# Patient Record
Sex: Female | Born: 1960 | Hispanic: Yes
Health system: Southern US, Community
[De-identification: ages and names within clinical notes are randomized; demographics above are authoritative.]

---

## 2018-08-02 ENCOUNTER — Ambulatory Visit: Payer: Self-pay | Admitting: Sports Medicine

## 2018-08-02 ENCOUNTER — Other Ambulatory Visit: Payer: Self-pay

## 2019-02-03 DIAGNOSIS — R079 Chest pain, unspecified: Secondary | ICD-10-CM

## 2019-02-03 DIAGNOSIS — R0789 Other chest pain: Secondary | ICD-10-CM

## 2019-02-03 DIAGNOSIS — K219 Gastro-esophageal reflux disease without esophagitis: Secondary | ICD-10-CM

## 2020-04-03 DIAGNOSIS — E875 Hyperkalemia: Secondary | ICD-10-CM

## 2020-04-07 ENCOUNTER — Telehealth: Payer: Self-pay

## 2020-04-07 NOTE — Telephone Encounter (Signed)
NOTES ON FILE FROM  [facility] [phone], SENT REFERRAL TO SCHEDULING 

## 2020-04-16 ENCOUNTER — Encounter: Payer: Self-pay | Admitting: General Practice

## 2021-10-07 ENCOUNTER — Other Ambulatory Visit: Payer: Self-pay | Admitting: Family Medicine

## 2021-10-07 DIAGNOSIS — Z1231 Encounter for screening mammogram for malignant neoplasm of breast: Secondary | ICD-10-CM

## 2021-10-10 ENCOUNTER — Other Ambulatory Visit: Payer: Self-pay | Admitting: Family Medicine

## 2021-10-10 DIAGNOSIS — Z1231 Encounter for screening mammogram for malignant neoplasm of breast: Secondary | ICD-10-CM

## 2021-10-28 ENCOUNTER — Other Ambulatory Visit: Payer: Self-pay

## 2021-10-28 ENCOUNTER — Ambulatory Visit
Admission: RE | Admit: 2021-10-28 | Discharge: 2021-10-28 | Disposition: A | Discharge status: Home / Self Care | Payer: No Typology Code available for payment source | Source: Ambulatory Visit | Attending: Family Medicine | Admitting: Family Medicine

## 2021-10-28 DIAGNOSIS — Z1231 Encounter for screening mammogram for malignant neoplasm of breast: Secondary | ICD-10-CM

## 2023-04-11 IMAGING — MG MM DIGITAL SCREENING BILAT W/ TOMO AND CAD
8 series · 9 of 24 positions shown · non-contrast
Comparison: None.

CLINICAL DATA: Screening.

EXAM:
DIGITAL SCREENING BILATERAL MAMMOGRAM WITH TOMOSYNTHESIS AND CAD
TECHNIQUE: Bilateral screening digital craniocaudal and mediolateral oblique
mammograms were obtained. Bilateral screening digital breast
tomosynthesis was performed. The images were evaluated with
computer-aided detection.

[R CC synth-2D]
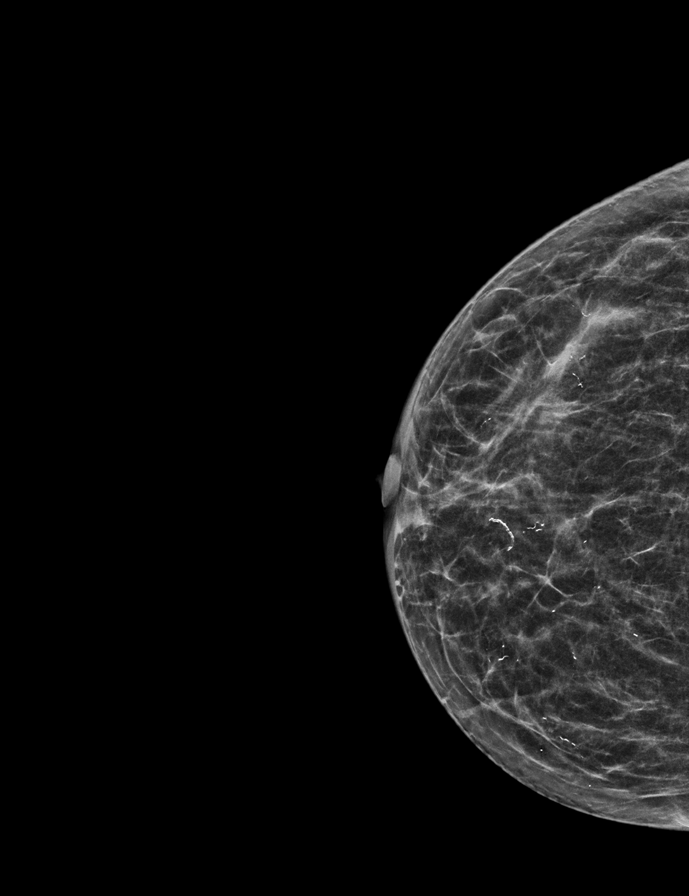

[L CC synth-2D]
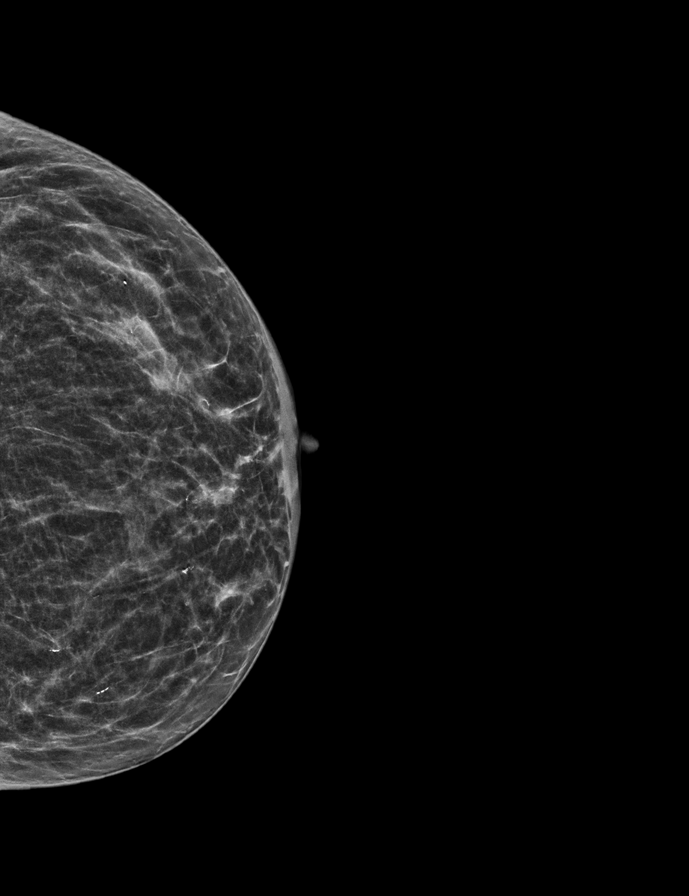

[R MLO synth-2D]
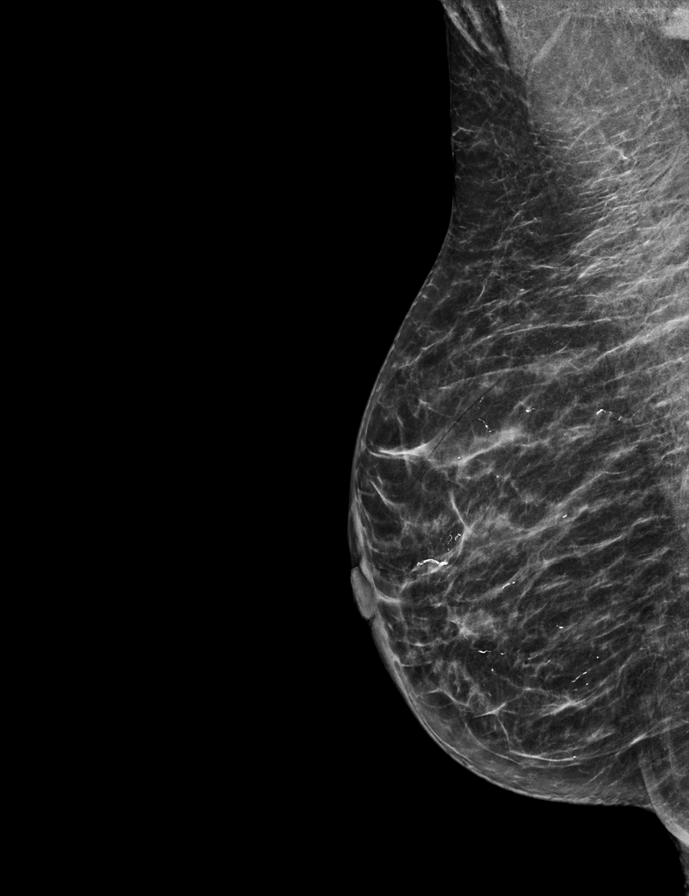

[L MLO synth-2D]
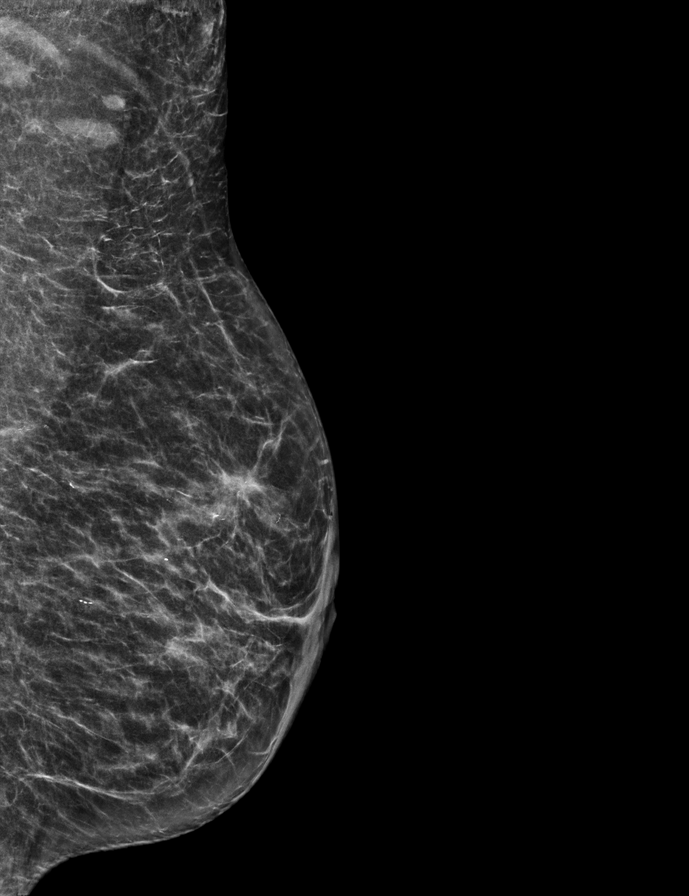

[R CC tomo · 2 of 52 frames shown]
[frame 17/52]
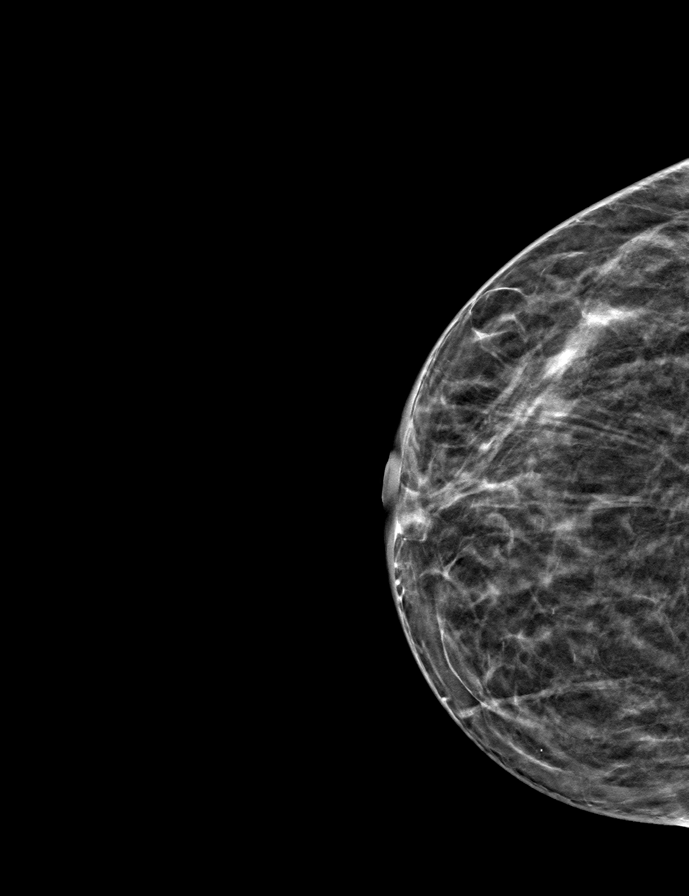
[frame 27/52]
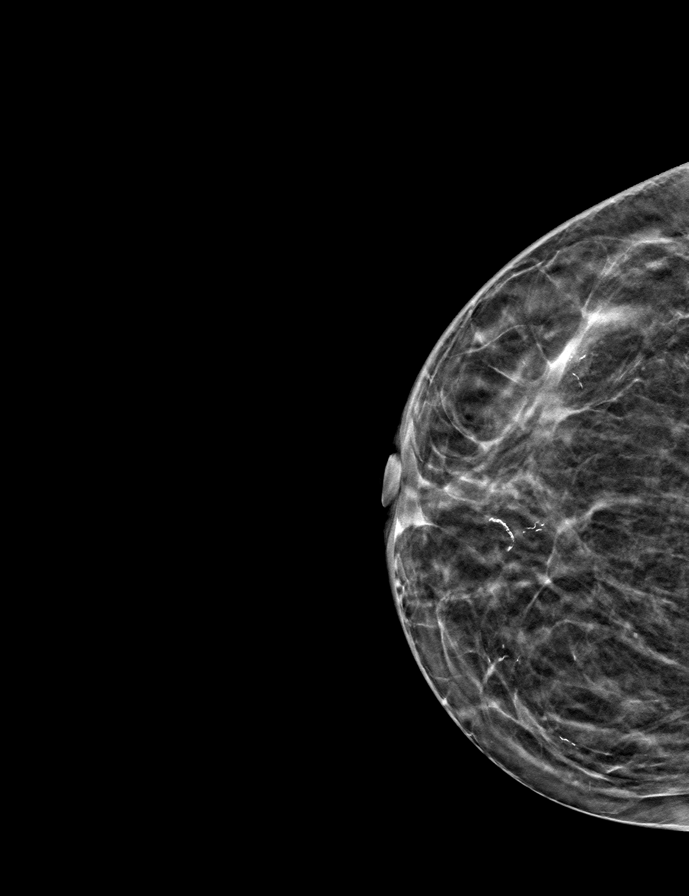

[L CC tomo · tomo slice 25/49.0]
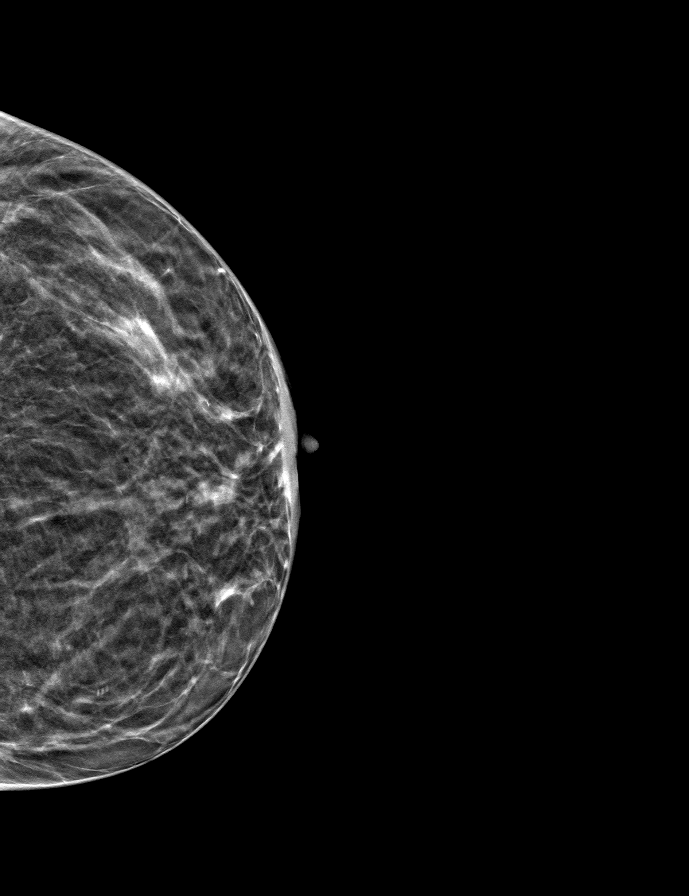

[R MLO tomo · tomo slice 28/55.0]
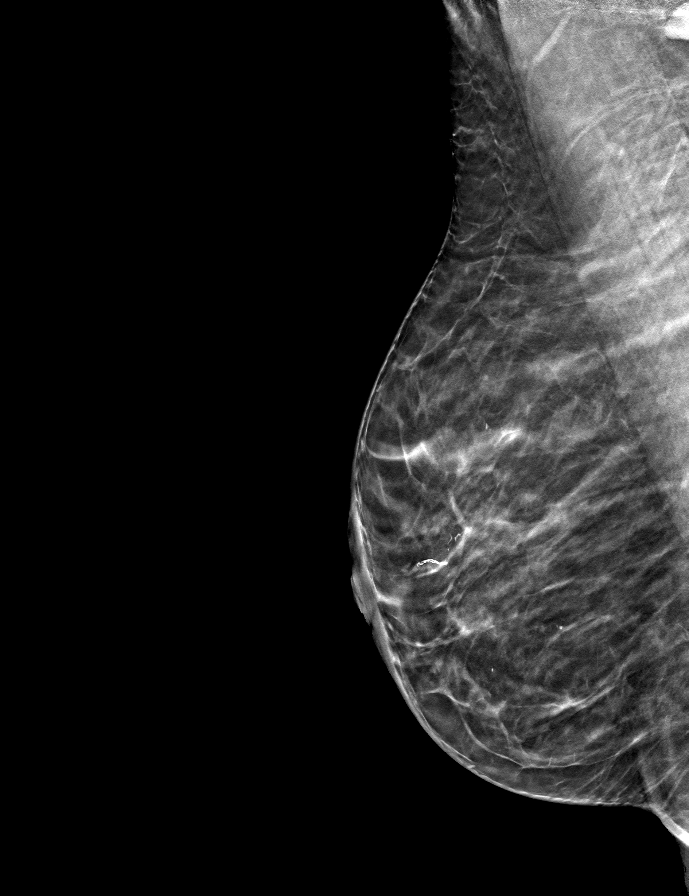

[L MLO tomo · tomo slice 29/56.0]
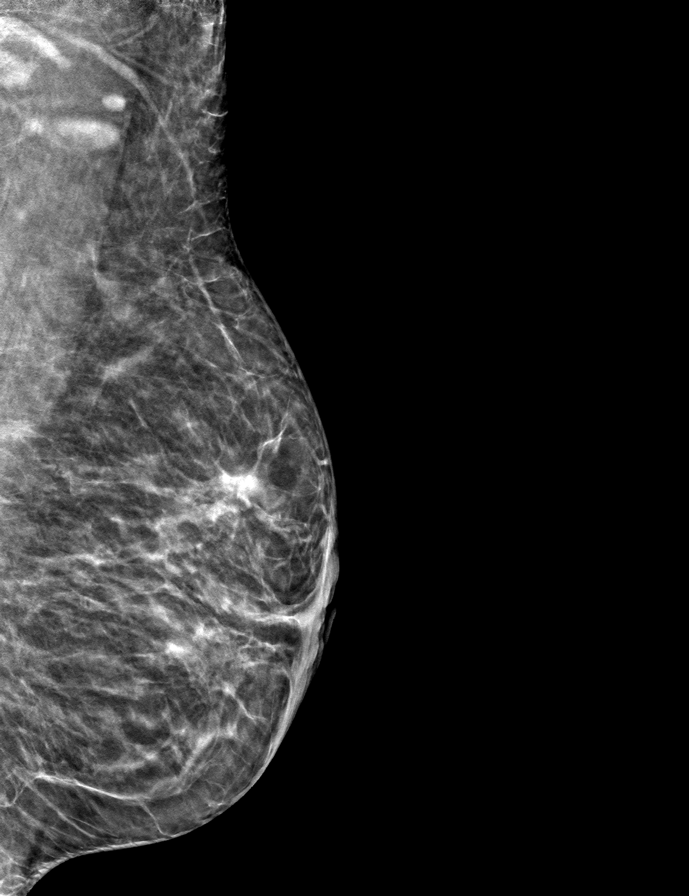

[9 of 24 positions shown; findings below may reference images not displayed]

ACR Breast Density Category b: There are scattered areas of
fibroglandular density.
FINDINGS: There are no findings suspicious for malignancy.
IMPRESSION: No mammographic evidence of malignancy. A result letter of this
screening mammogram will be mailed directly to the patient.

RECOMMENDATION:
Screening mammogram in one year. (Code:XG-X-X7B)

BI-RADS CATEGORY  1: Negative.

## 2023-12-19 NOTE — H&P (Signed)
 [place] INTERVENTIONAL NEPHROLOGY- Pre Procedure H/P  Patient name: Molly Phillips  CSN: [id number]  MRN: [id number]  Date of Procedure: 12/19/2023    Assessment/Plan:    [name] is a 63 y.o. female who will undergo fistulogram with possible angioplasty and/or stent placement under moderate sedation in interventional nephrology at [facility] endovascular center.     Consent obtained in the Pre Op holding area by Dr. Tiffany Kocher.  Risks, benefits, and alternatives including but not limited to risks like bleeding, blood clots, infection, embolism, thromboembolism, pain, vascular injury were discussed with patient/patient's representative. All questions were answered to patient/patient's representative satisfaction.  Patient/Patient's representative consents and would like to proceed with the procedure.   --The patient will accept blood products in an emergent situation.  --The patient does not have a Do Not Resuscitate order in effect.      HPI: [name] is a 63 y.o. female with ESRD on intermittent hemodialysis via a left upper arm brachial basilic AV fistula comes in for a fistulogram due to prolonged bleeding postdialysis.  Patient denies any other complaints today.  On physical exam the fistula is highly pulsatile on palpation and does not collapse on arm elevation.    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, unspecified proliferative retinopathy type    DM (diabetes mellitus) (CMS-HCC) Dx 08/2016    Type II    Hypertension     Peripheral chorioretinal scars of both eyes     Stable       Past Surgical History:   Procedure Laterality Date    PR AV ANAST,UP ARM BASILIC VEIN TRANSPOSIT Left 05/12/3085    Procedure: ARTERIOVENOUS ANASTOMOSIS, OPEN; BY UPPER ARM BASILIC VEIN TRANSPOSITION;  Surgeon: Leona Carry, MD;  Location: MAIN OR [place];  Service: Transplant    PR CREAT AV FISTULA,NON-AUTOGENOUS GRAFT N/A 08/04/2022    Procedure: CREATE AV FISTULA (SEPARATE PROC); NONAUTOGENOUS GRAFT (EG, BIOLOGICAL COLLAGEN, THERMOPLASTIC GRAFT);  Surgeon: Leona Carry, MD;  Location: MAIN OR [facility];  Service: Transplant    PR REVISE AV FISTULA,W/O THROMBECTOMY Left 02/23/2023    Procedure: REVISON, OPEN, ARTERIOVENOUS FISTULA; WITHOUT THROMBECTOMY, AUTOGENOUS OR NONAUTOGENOUS DIALYSIS GRAFT;  Surgeon: Toledo, Lilyan Punt, MD;  Location: MAIN OR [facility];  Service: Transplant    PR VITRECTOMY,PANRETINAL LASER RX Left 11/03/2020    Procedure: VITRECTOMY, MECHANICAL, PARS PLANA APPROACH; WITH ENDOLASER PANRETINAL PHOTOCOAGULATION;  Surgeon: Melvyn Novas, MD;  Location: [facility] OR [facility];  Service: Ophthalmology    PR Hessie Knows LASER RX Right 06/30/2022    Procedure: VITRECTOMY, MECHANICAL, PARS PLANA APPROACH; WITH ENDOLASER PANRETINAL PHOTOCOAGULATION;  Surgeon: Nicholaus Corolla, MD;  [facility]: [facility] OR [facility];  Service: Ophthalmology    PR XCAPSL CTRC RMVL INSJ IO LENS PROSTH CPLX WO ECP Right 07/05/2023    Procedure: EXTRACAPSULAR CATARACT REMOVAL WITH INSERTION OF INTRAOCULAR LENS PROSTHESIS, COMPLEX WITHOUT ENDOSCOPIC CYCLOPHOTOCOAGULATION;  Surgeon: Sedalia Muta, MD;  Location: [facility];  Service: Ophthalmology    PR XCAPSL CTRC RMVL INSJ IO LENS PROSTH W/O ECP Left 04/20/2023    Procedure: EXTRACAPSULAR CATARACT REMOVAL W/INSERTION OF INTRAOCULAR LENS PROSTHESIS, MANUAL OR MECHANICAL TECHNIQUE WITHOUT ENDOSCOPIC CYCLOPHOTOCOAGULATION;  Surgeon: Sedalia Muta, MD;  Location: [facility];  Service: Ophthalmology  Allergies:   Allergies   Allergen Reactions    Fluorescein Sodium Other (See Comments)     Itchiness   Itchiness     Naprosyn [Naproxen] Itching    Penicillins Rash       Medications:  No relevant medications, please see full medication list in Epic.    ASA Grade: ASA 3 - Patient with moderate systemic disease with functional limitations    PE:    Vitals:    12/19/23 1331   BP: 169/65   Pulse: 66   Resp: 18   Temp: 36.6 ??C (97.9 ??F)   SpO2: 98%     General: female in NAD.  Airway assessment: Class 1 - Can visualize soft palate, fauces, uvula, and tonsillar pillars  Lungs: Respirations nonlabored  Extremities: On physical exam the fistula is highly pulsatile on palpation and does not collapse on arm elevation.

## 2024-06-04 NOTE — ED Provider Notes (Signed)
 [facility] EMERGENCY DEPARTMENT ENCOUNTER        CLINICAL IMPRESSION  Final diagnoses:   Hyperkalemia (Primary)   Elevated blood pressure reading with diagnosis of hypertension   Dialysis patient       ASSESSMENT & PLAN    63 year old dialysis patient presenting with uncontrolled high blood pressures, shortness of breath, and headache.    She is already starting to feel better, as her blood pressure is coming down after taking her regular home medications.    EKG, CBC, CMP, troponin performed here at the hospital.  Noted to have hyperkalemia with a potassium 6.0.    Reassuring EKG.  This is certainly higher than her baseline.    Given IV Lasix  80 mg, which did result in an episode of urination.  She was also given 5 mg of Lokelma .    Labs repeated.  She feels much better, breathing is improved, blood pressures have also come down, no hypoxemia.    Potassium now down to 5, which is stable and consistent at her baseline.    Discharged home with her family in stable condition, return precautions in case of any worsening, encouraged to maintain excellent compliance with her home medications, she does have access to these and refills are current.  She will proceed with dialysis on Tuesday as scheduled.      _______________________________________________________________    Time seen: June 04, 2024 3:59 AM    I have reviewed the triage vital signs and the nursing notes.    CHIEF COMPLAINT    Chief Complaint   Patient presents with    Hypertension       History of Present Illness  Molly Phillips is a 63 year old female with history of hypertension who presents with elevated blood pressure and shortness of breath.    She experienced a significant elevation in blood pressure earlier, which improved after taking her regular home medication. She last underwent dialysis on Saturday and is scheduled for her next session on Tuesday.    At this time, she is feeling better, says she almost always feels this way when her blood pressure spikes up.  Specifically her breathing is returned to baseline.  She does occasionally make urine, but has not peed in the last 24 hours.       PAST MEDICAL HISTORY    Past Medical History[1]    SURGICAL HISTORY    Past Surgical History[2]    CURRENT MEDICATIONS    No current facility-administered medications for this encounter.    Current Outpatient Medications:     atorvastatin  (LIPITOR ) 80 MG tablet, Take 1 tablet (80 mg total) by mouth daily., Disp: 100 tablet, Rfl: 3    carvedilol  (COREG ) 25 MG tablet, Take 1 tablet (25 mg total) by mouth two (2) times a day., Disp: 200 tablet, Rfl: 3    dulaglutide  (TRULICITY ) 0.75 mg/0.5 mL injection pen, Inject 1 pen (0.75 mg total) under the skin every seven (7) days., Disp: 6 mL, Rfl: 3    ergocalciferol -1,250 mcg, 50,000 unit, (DRISDOL ) 1,250 mcg (50,000 unit) capsule, Take 1 capsule (1,250 mcg total) by mouth once a week., Disp: 12 capsule, Rfl: 3    famotidine  (PEPCID ) 20 MG tablet, Take 1 tablet (20 mg total) by mouth nightly as needed for heartburn., Disp: 30 tablet, Rfl: 2    ferrous sulfate  325 (65 FE) MG tablet, Take 1 tablet (325 mg total) by mouth in the morning., Disp: 30 tablet, Rfl: 11    lancets (ON  CALL LANCET) 30 gauge Misc, Use to check blood sugar 1 time a day, Disp: 100 each, Rfl: 11    NIFEdipine  (PROCARDIA  XL) 90 MG 24 hr tablet, Take 1 tablet (90 mg total) by mouth daily., Disp: 100 tablet, Rfl: 3    torsemide  (DEMADEX ) 20 MG tablet, Take 2 tablets (40 mg total) by mouth Every Monday, Wednesday, Friday, and Sunday (non-dialysis days), Disp: 90 tablet, Rfl: 3    blood-glucose meter kit, Use as instructed, Disp: 1 each, Rfl: 0    hydrALAZINE (APRESOLINE) 100 MG tablet, Take 1 tablet (100 mg total) by mouth Three (3) times a day., Disp: 300 tablet, Rfl: 3    iron sucrose 50 mg, OVERFILL 10 mL in sodium chloride 0.9 % 100 mL IVPB, 50 mg., Disp: , Rfl:     lancing device (ON CALL LANCING DEVICE) Misc, Use as directed, Disp: 1 each, Rfl: 0 loperamide (IMODIUM) 0.2 mg/mL Liqd oral solution, Take 2 mg by mouth., Disp: , Rfl:     ALLERGIES    Allergies[3]    FAMILY HISTORY    Family History[4]    SOCIAL HISTORY  Social History[5]    REVIEW OF SYSTEMS    Patient denies fever, chills, focal weakness  Positive headache  No eye pain or vision changes  No ear pain or decreased hearing  No sore throat or difficulty swallowing  No chest pain  No cough positive shortness of breath  No nausea, vomiting, diarrhea, or bloody stools  No joint pain or swelling  No skin rashes or lesions  No syncope or seizure activity  Denies depressed mood or thought disturbance      10  point ROS negative except as marked above and in HPI.    PHYSICAL EXAM      VITAL SIGNS:    ED Triage Vitals   Enc Vitals Group      BP 06/03/24 2242 204/82      Pulse 06/03/24 2242 72      SpO2 Pulse 06/04/24 0226 66      Resp 06/03/24 2242 22      Temp 06/03/24 2242 36.6 ??C (97.9 ??F)      Temp Source 06/03/24 2242 Temporal      SpO2 06/03/24 2242 95 %      Weight 06/03/24 2242 46.3 kg (102 lb)      Height 06/03/24 2242 1.499 m (4' 11)                                       General: alert and oriented, resting comfortably in no distress  MMM, benign oropharynx  Nares clear without discharge  TMs clear without erythema or effusion  Neck supple without nodes, normal thyroid  RRR with 2 out of 6 holosystolic murmur, no JVD, no bruits, 2+ peripheral pulses  Lungs with bibasilar crackles, left greater than right, easy work of breathing,  Abd soft, non-tender, not distended, normal bowel sounds, no HSM, no CVA tenderness  Ext warm and well perfused, no peripheral edema  No painful or swollen joints, ambulates without assistance  Skin warm, dry, intact.  No rashes, lesions, or open sores  Neuro: No focal deficits  Psych: clear and appropriate, full affect, denies depressed mood      EKG   Normal sinus rhythm, rate 71, QTc 462.  Normal axis and intervals, no evidence of acute ischemia.  LABS  Recent Results (from the past 24 hours)   ECG 12 Lead    Collection Time: 06/03/24 10:36 PM   Result Value Ref Range    EKG Systolic BP  mmHg    EKG Diastolic BP  mmHg    EKG Ventricular Rate 71 BPM    EKG Atrial Rate 71 BPM    EKG P-R Interval 188 ms    EKG QRS Duration 72 ms    EKG Q-T Interval 426 ms    EKG QTC Calculation 462 ms    EKG Calculated P Axis 58 degrees    EKG Calculated R Axis 42 degrees    EKG Calculated T Axis 83 degrees    QTC Fredericia 450 ms   Comprehensive Metabolic Panel    Collection Time: 06/03/24 10:43 PM   Result Value Ref Range    Sodium 137 135 - 145 mmol/L    Potassium 6.0 (H) 3.5 - 5.0 mmol/L    Chloride 96 (L) 98 - 107 mmol/L    CO2 27.0 22.0 - 32.0 mmol/L    Anion Gap 14 5 - 14 mmol/L    BUN 51 (H) 7 - 21 mg/dL    Creatinine 4.09 (H) 0.60 - 1.00 mg/dL    BUN/Creatinine Ratio 9     eGFR CKD-EPI (2021) Female 8 (L) >=60 mL/min/1.7m2    Glucose 95 74 - 106 mg/dL    Calcium 8.8 8.5 - 89.7 mg/dL    Albumin 4.3 3.4 - 5.0 g/dL    Total Protein 7.7 6.5 - 8.3 g/dL    Total Bilirubin 0.5 0.1 - 1.2 mg/dL    AST 29 14 - 38 U/L    ALT 39 (H) <35 U/L    Alkaline Phosphatase 193 (H) 38 - 126 U/L   PT-INR    Collection Time: 06/03/24 10:43 PM   Result Value Ref Range    PT 11.8 9.9 - 12.6 sec    INR 1.04 Undefined   Troponin I    Collection Time: 06/03/24 10:43 PM   Result Value Ref Range    Troponin I <0.034 <0.034 ng/mL   CBC w/ Differential    Collection Time: 06/03/24 10:43 PM   Result Value Ref Range    WBC 7.0 3.6 - 11.2 10*9/L    RBC 3.69 (L) 3.95 - 5.13 10*12/L    HGB 12.0 11.3 - 14.9 g/dL    HCT 64.8 65.9 - 55.9 %    MCV 95.2 77.6 - 95.7 fL    MCH 32.6 (H) 25.9 - 32.4 pg    MCHC 34.2 32.0 - 36.0 g/dL    RDW 83.8 (H) 87.7 - 15.2 %    MPV 7.7 6.8 - 10.7 fL    Platelet 192 150 - 450 10*9/L    nRBC 0 <=4 /100 WBCs    Neutrophils % 68.6 %    Lymphocytes % 19.7 %    Monocytes % 9.4 %    Eosinophils % 1.7 %    Basophils % 0.6 %    Absolute Neutrophils 4.8 1.8 - 7.8 10*9/L    Absolute Lymphocytes 1.4 1.1 - 3.6 10*9/L    Absolute Monocytes 0.7 0.3 - 0.8 10*9/L    Absolute Eosinophils 0.1 0.0 - 0.5 10*9/L    Absolute Basophils 0.0 0.0 - 0.1 10*9/L   Basic metabolic panel    Collection Time: 06/04/24  2:26 AM   Result Value Ref Range    Sodium 137 135 - 145 mmol/L  Potassium 5.0 3.5 - 5.0 mmol/L    Chloride 100 98 - 107 mmol/L    CO2 26.0 22.0 - 32.0 mmol/L    Anion Gap 11 5 - 14 mmol/L    BUN 50 (H) 7 - 21 mg/dL    Creatinine 4.49 (H) 0.60 - 1.00 mg/dL    BUN/Creatinine Ratio 9     eGFR CKD-EPI (2021) Female 8 (L) >=60 mL/min/1.16m2    Glucose 87 74 - 106 mg/dL    Calcium 8.3 (L) 8.5 - 10.2 mg/dL         PROCEDURE      ED COURSE  Pertinent labs & imaging results that were available during my care of the patient were reviewed by me (see chart for details).        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, unspecified proliferative retinopathy type    DM (diabetes mellitus)    Dx 08/2016    Type II    ESRD (end stage renal disease) on dialysis        Hypertension     Peripheral chorioretinal scars of both eyes     Stable   [2]   Past Surgical History:  Procedure Laterality Date    PR AV ANAST,UP ARM BASILIC VEIN TRANSPOSIT Left 0/86/7976    Procedure: ARTERIOVENOUS ANASTOMOSIS, OPEN; BY UPPER ARM BASILIC VEIN TRANSPOSITION;  Surgeon: Marsa Sam Boss, MD;  Location: MAIN OR [place];  Service: Transplant    PR CREAT AV FISTULA,NON-AUTOGENOUS GRAFT N/A 08/04/2022    Procedure: CREATE AV FISTULA (SEPARATE PROC); NONAUTOGENOUS GRAFT (EG, BIOLOGICAL COLLAGEN, THERMOPLASTIC GRAFT);  Surgeon: Marsa Sam Boss, MD;  Location: MAIN OR [facility];  Service: Transplant    PR REVISE AV FISTULA,W/O THROMBECTOMY Left 02/23/2023    Procedure: REVISON, OPEN, ARTERIOVENOUS FISTULA; WITHOUT THROMBECTOMY, AUTOGENOUS OR NONAUTOGENOUS DIALYSIS GRAFT;  Surgeon: Toledo, Marsa Sam, MD;  [facility];  Service: Transplant    PR VITRECTOMY,PANRETINAL LASER RX Left 11/03/2020    Procedure: VITRECTOMY, MECHANICAL, PARS PLANA APPROACH; WITH ENDOLASER PANRETINAL PHOTOCOAGULATION;  Surgeon: Madison Concetta Hoof, MD;  Location: [facility] OR [facility];  Service: Ophthalmology    PR PINKIE BOSWORTH LASER RX Right 06/30/2022    Procedure: VITRECTOMY, MECHANICAL, PARS PLANA APPROACH; WITH ENDOLASER PANRETINAL PHOTOCOAGULATION;  Surgeon: Diona Mana, MD;  Location: [facility] OR [facility];  Service: Ophthalmology    PR XCAPSL CTRC RMVL INSJ IO LENS PROSTH CPLX WO ECP Right 07/05/2023    Procedure: EXTRACAPSULAR CATARACT REMOVAL WITH INSERTION OF INTRAOCULAR LENS PROSTHESIS, COMPLEX WITHOUT ENDOSCOPIC CYCLOPHOTOCOAGULATION;  Surgeon: Klifto, Meredith Remmer, MD;  Location: [facility];  Service: Ophthalmology    PR XCAPSL CTRC RMVL INSJ IO LENS PROSTH W/O ECP Left 04/20/2023    Procedure: EXTRACAPSULAR CATARACT REMOVAL W/INSERTION OF INTRAOCULAR LENS PROSTHESIS, MANUAL OR MECHANICAL TECHNIQUE WITHOUT ENDOSCOPIC CYCLOPHOTOCOAGULATION;  Surgeon: Klifto, Meredith Remmer, MD;  Location: [facility];  Service: Ophthalmology   [3]   Allergies  Allergen Reactions    Fluorescein Sodium Other (See Comments)     Itchiness   Itchiness     Naprosyn [Naproxen] Itching    Penicillins Rash   [4]  Family History  Problem Relation Age of Onset    Diabetes Mother     Retinal detachment Mother     Diabetes Sister     Diabetes Brother     No Known Problems Father     No Known Problems Maternal Aunt     No Known Problems Maternal Uncle     No Known Problems Paternal Aunt     No Known Problems Paternal Uncle     No Known Problems Maternal Grandmother     No Known Problems Maternal Grandfather     No Known Problems Paternal Grandmother     No Known Problems Paternal Grandfather     No Known Problems Other     Amblyopia Neg Hx Blindness Neg Hx     Cancer Neg Hx     Cataracts Neg Hx     Glaucoma Neg Hx     Hypertension Neg Hx     Macular degeneration Neg Hx     Strabismus Neg Hx     Stroke Neg Hx     Thyroid disease Neg Hx    [5]   Social History  Socioeconomic History    Marital status: Single     Spouse name: None    Number of children: None    Years of education: None    Highest education level: None   Tobacco Use    Smoking status: Never     Passive exposure: Never    Smokeless tobacco: Never   Vaping Use    Vaping status: Never Used   Substance and Sexual Activity    Alcohol use: Never    Drug use: Never    Sexual activity: Not Currently     Social Drivers of Health     Financial Resource Strain: Low Risk  (01/17/2023)    Overall Financial Resource Strain (CARDIA)     Difficulty of Paying Living Expenses: Not hard at all   Food Insecurity: No Food Insecurity (10/28/2023)    Hunger Vital Sign     Worried About Running Out of Food in the Last Year: Never true     Ran Out of Food in the Last Year: Never true   Transportation Needs: No Transportation Needs (10/28/2023)    PRAPARE - Therapist, art (Medical): No     Lack of Transportation (Non-Medical): No   Physical Activity: Not on File (03/11/2022)    Received from [facility]    Physical Activity     Physical Activity: 0   Stress: Not on File (03/11/2022)    Received from [facility]    Stress     Stress: 0   Social Connections: Not on File (08/06/2023)    Received from [facility]    Social Connections     Connectedness: 0   Housing: Low Risk  (10/28/2023)    Housing     Within the past 12 months, have you ever stayed: outside, in a car, in a tent, in an overnight shelter, or temporarily in someone else's home (i.e. couch-surfing)?: No     Are you worried about losing your housing?: No        Brentt Fread, Nidia Ruts, MD  06/04/24 [phone]

## 2024-07-12 NOTE — Progress Notes (Signed)
*  Possible allergy to fluorescein - reported itchiness after FA*     63 y.o. female with hx of HTN, DM, CKD presents for follow up.    Last A1c 03/2023 was 6.9. On trulicity .     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 Hunter

## 2024-07-21 ENCOUNTER — Emergency Department (HOSPITAL_COMMUNITY)

## 2024-07-21 ENCOUNTER — Other Ambulatory Visit: Payer: Self-pay

## 2024-07-21 ENCOUNTER — Emergency Department (HOSPITAL_COMMUNITY)
Admission: EM | Admit: 2024-07-21 | Discharge: 2024-07-21 | Disposition: A | Discharge status: Home / Self Care | Attending: Emergency Medicine | Admitting: Emergency Medicine

## 2024-07-21 DIAGNOSIS — E1122 Type 2 diabetes mellitus with diabetic chronic kidney disease: Secondary | ICD-10-CM | POA: Insufficient documentation

## 2024-07-21 DIAGNOSIS — Z992 Dependence on renal dialysis: Secondary | ICD-10-CM | POA: Insufficient documentation

## 2024-07-21 DIAGNOSIS — R1013 Epigastric pain: Secondary | ICD-10-CM | POA: Insufficient documentation

## 2024-07-21 DIAGNOSIS — I120 Hypertensive chronic kidney disease with stage 5 chronic kidney disease or end stage renal disease: Secondary | ICD-10-CM | POA: Insufficient documentation

## 2024-07-21 DIAGNOSIS — R112 Nausea with vomiting, unspecified: Secondary | ICD-10-CM | POA: Insufficient documentation

## 2024-07-21 DIAGNOSIS — N186 End stage renal disease: Secondary | ICD-10-CM | POA: Insufficient documentation

## 2024-07-21 DIAGNOSIS — N189 Chronic kidney disease, unspecified: Secondary | ICD-10-CM

## 2024-07-21 DIAGNOSIS — D631 Anemia in chronic kidney disease: Secondary | ICD-10-CM | POA: Insufficient documentation

## 2024-07-21 LAB — COMPREHENSIVE METABOLIC PANEL WITH GFR
ALT: 34 U/L (ref 0–44)
AST: 29 U/L (ref 15–41)
Albumin: 3.5 g/dL (ref 3.5–5.0)
Alkaline Phosphatase: 103 U/L (ref 38–126)
Anion gap: 14 (ref 5–15)
BUN: 48 mg/dL — ABNORMAL HIGH (ref 8–23)
CO2: 27 mmol/L (ref 22–32)
Calcium: 8.8 mg/dL — ABNORMAL LOW (ref 8.9–10.3)
Chloride: 94 mmol/L — ABNORMAL LOW (ref 98–111)
Creatinine, Ser: 4.96 mg/dL — ABNORMAL HIGH (ref 0.44–1.00)
GFR, Estimated: 9 mL/min — ABNORMAL LOW (ref >60)
Glucose, Bld: 124 mg/dL — ABNORMAL HIGH (ref 70–99)
Potassium: 5.1 mmol/L (ref 3.5–5.1)
Sodium: 135 mmol/L (ref 135–145)
Total Bilirubin: 0.6 mg/dL (ref 0.0–1.2)
Total Protein: 6.8 g/dL (ref 6.5–8.1)

## 2024-07-21 LAB — CBC
HCT: 30.1 % — ABNORMAL LOW (ref 36.0–46.0)
Hemoglobin: 9.9 g/dL — ABNORMAL LOW (ref 12.0–15.0)
MCH: 32.7 pg (ref 26.0–34.0)
MCHC: 32.9 g/dL (ref 30.0–36.0)
MCV: 99.3 fL (ref 80.0–100.0)
Platelets: 173 K/uL (ref 150–400)
RBC: 3.03 MIL/uL — ABNORMAL LOW (ref 3.87–5.11)
RDW: 15.5 % (ref 11.5–15.5)
WBC: 7.5 K/uL (ref 4.0–10.5)
nRBC: 0 % (ref 0.0–0.2)

## 2024-07-21 LAB — LIPASE, BLOOD: Lipase: 33 U/L (ref 11–51)

## 2024-07-21 MED ORDER — ONDANSETRON HCL 4 MG/2ML IJ SOLN
4.0000 mg | Freq: Once | INTRAMUSCULAR | Status: AC
  Administered 2024-07-21: 4 mg via INTRAVENOUS
  Filled 2024-07-21: qty 2

## 2024-07-21 MED ORDER — FAMOTIDINE 10 MG PO TABS: 10.0000 mg | ORAL_TABLET | Freq: Two times a day (BID) | ORAL | 0 refills | Status: AC

## 2024-07-21 MED ORDER — SODIUM CHLORIDE 0.9 % IV BOLUS
500.0000 mL | Freq: Once | INTRAVENOUS | Status: AC
  Administered 2024-07-21: 500 mL via INTRAVENOUS

## 2024-07-21 MED ORDER — ONDANSETRON 4 MG PO TBDP: 4.0000 mg | ORAL_TABLET | Freq: Three times a day (TID) | ORAL | 0 refills | Status: AC | PRN

## 2024-07-21 MED ORDER — IOHEXOL 350 MG/ML SOLN
75.0000 mL | Freq: Once | INTRAVENOUS | Status: AC | PRN
  Administered 2024-07-21: 75 mL via INTRAVENOUS

## 2024-07-21 MED ORDER — MORPHINE SULFATE (PF) 4 MG/ML IV SOLN
4.0000 mg | Freq: Once | INTRAVENOUS | Status: DC
  Filled 2024-07-21: qty 1

## 2024-07-21 MED ORDER — OXYCODONE HCL 5 MG PO TABS: 5.0000 mg | ORAL_TABLET | ORAL | 0 refills | Status: AC | PRN

## 2024-07-21 NOTE — Discharge Instructions (Addendum)
 Llame a su centro de dilisis para ver si puede reprogramar su sesin.  Regrese a urgencias si sus sntomas empeoran.

## 2024-07-21 NOTE — ED Triage Notes (Addendum)
 Patient reports persistent emesis with generalized abdominal pressure for several days , denies diarrhea or fever . Hypertensive at triage .

## 2024-07-21 NOTE — ED Provider Notes (Signed)
 [place] [facility] Provider Note   CSN: [id number] Arrival date & time: 07/21/24  9957     Patient presents with: Emesis   Molly Phillips is a 63 y.o. female.   The history is provided by the patient. A language interpreter was used.  Emesis  She has a history of hypertension, diabetes, end-stage renal disease on hemodialysis and comes in because of upper abdominal pain and vomiting for the last week.  She denies fever or chills.  Denies constipation or diarrhea.  She has been having bowel movements.  She has not been able to hold anything down.  Dialysis is done every Tuesday-Thursday-Saturday.    Prior to Admission medications   Medication Sig Start Date End Date Taking? Authorizing Provider  clindamycin (CLEOCIN) 300 MG capsule Take 300 mg by mouth 3 (three) times daily. 07/29/18   [provider]    Allergies: Naproxen and Penicillins    Review of Systems  Gastrointestinal:  Positive for vomiting.  All other systems reviewed and are negative.   Updated Vital Signs BP (!) 188/61 (BP Location: Right Arm)   Pulse 66   Temp 98.1 F (36.7 C)   Resp 14   SpO2 95%   Physical Exam Vitals and nursing note reviewed.   63 year old female, resting comfortably and in no acute distress. Vital signs are significant for elevated blood pressure. Oxygen saturation is 95%, which is normal. Head is normocephalic and atraumatic. PERRLA, EOMI.  Back is nontender and there is no CVA tenderness. Lungs are clear without rales, wheezes, or rhonchi. Chest is nontender. Heart has regular rate and rhythm without murmur. Abdomen is soft, flat, with moderate tenderness across the upper abdomen.  There is no rebound or guarding. Extremities have no cyanosis or edema.  AV fistula is present in the left upper arm with thrill present. Skin is warm and dry without rash. Neurologic: Awake and alert, moves all extremities equally.  (all labs ordered  are listed, but only abnormal results are displayed) Labs Reviewed  COMPREHENSIVE METABOLIC PANEL WITH GFR - Abnormal; Notable for the following components:      Result Value   Chloride 94 (*)    Glucose, Bld 124 (*)    BUN 48 (*)    Creatinine, Ser 4.96 (*)    Calcium 8.8 (*)    GFR, Estimated 9 (*)    All other components within normal limits  CBC - Abnormal; Notable for the following components:   RBC 3.03 (*)    Hemoglobin 9.9 (*)    HCT 30.1 (*)    All other components within normal limits  LIPASE, BLOOD  URINALYSIS, ROUTINE W REFLEX MICROSCOPIC    Radiology: CT ABDOMEN PELVIS W CONTRAST Result Date: 07/21/2024 CLINICAL DATA:  63 year old female with abdominal pain. EXAM: CT ABDOMEN AND PELVIS WITH CONTRAST TECHNIQUE: Multidetector CT imaging of the abdomen and pelvis was performed using the standard protocol following bolus administration of intravenous contrast. RADIATION DOSE REDUCTION: This exam was performed according to the departmental dose-optimization program which includes automated exposure control, adjustment of the mA and/or kV according to patient size and/or use of iterative reconstruction technique. CONTRAST:  75mL OMNIPAQUE  IOHEXOL  350 MG/ML SOLN COMPARISON:  None Available. FINDINGS: Lower chest: Cardiomegaly is moderate. Layering pleural effusions with simple fluid density favoring transudate, moderate to large on the right and small to moderate on the left. Enhancing bilateral lung base atelectasis. No pericardial effusion. Hepatobiliary: Heterogeneously enhancing liver with small volume  perihepatic ascites, simple fluid density along the inferior right hepatic lobe series 3, image 42. And small volume ascites, pericholecystic fluid at the gallbladder fossa. Nondilated gallbladder. No bile duct dilatation. Periportal edema not excluded. No discrete liver lesion. Pancreas: Negative. Spleen: Nonenlarged. Small volume perisplenic and left abdominal ascites with simple  fluid density Adrenals/Urinary Tract: Normal adrenal glands. Diminutive but nonobstructed kidneys. Symmetric renal enhancement. Faint contrast excretion from both kidneys on the delayed images. Diminutive ureters and bladder. Numerous pelvic phleboliths. Stomach/Bowel: Mild to moderate large bowel retained stool in the rectum. Redundant but mostly decompressed upstream sigmoid colon. Similar mild retained stool in the descending and transverse colon. Negative right colon, with normal appendix visible on coronal image 48. Decompressed terminal ileum. Decompressed small bowel throughout the abdomen and pelvis. Decompressed stomach and duodenum. No pneumoperitoneum. Small volume ascites, more so in the left abdomen. Vascular/Lymphatic: Aortoiliac calcified atherosclerosis. Normal caliber abdominal aorta. Major arterial structures are patent despite atherosclerosis. Portal venous system is patent. No lymphadenopathy identified. Reproductive: Within normal limits. Other: No pelvis free fluid. Musculoskeletal: Osteopenia. No acute or suspicious osseous lesion identified. IMPRESSION: 1. Heterogeneous Liver enhancement (nonspecific periportal edemanot excluded), with small volume Ascites. Correlate with LFTs, consider acute or chronic hepatitis, other intrinsic Liver Disease. 2. Moderate to large layering right and small to moderate left pleural effusions with lung base atelectasis. Moderate cardiomegaly. No pericardial effusion. 3. Diminutive, nonobstructed kidneys, suggesting Chronic Renal Disease. There is renal contrast excretion visible on the delayed images. 4. Normal appendix. No bowel obstruction or discrete bowel inflammation. 5.  Aortic Atherosclerosis (ICD10-I70.0). Electronically Signed   By: VEAR Hurst M.D.   On: 07/21/2024 06:43     Procedures   Medications Ordered in the ED  sodium chloride  0.9 % bolus 500 mL (has no administration in time range)  ondansetron  (ZOFRAN ) injection 4 mg (has no  administration in time range)  morphine  (PF) 4 MG/ML injection 4 mg (has no administration in time range)                                    Medical Decision Making Amount and/or Complexity of Data Reviewed Labs: ordered. Radiology: ordered.  Risk OTC drugs. Prescription drug management.   Abdominal pain and vomiting in a patient who has history of end-stage renal disease.  This is her presentation with a wide range of treatment options and carries with it a high risk of morbidity and complications.  Differential diagnosis includes, but is not limited to, acute gastritis, peptic ulcer disease, cholecystitis, pancreatitis, diverticulitis, bowel obstruction.  I have reviewed her laboratory tests, and my interpretation is stable renal insufficiency, normal transaminases and bilirubin, normal lipase, stable anemia, normal WBC.  CT scan shows heterogeneous liver enhancement which is nonspecific, small volume ascites, bilateral pleural effusions, no acute process.  Have independently viewed the images, and agree with the radiologist's interpretation.  No evidence of diverticulitis or bowel obstruction.  She feels much better after above-noted treatment.  I am discharging her with prescriptions for famotidine , ondansetron  oral dissolving tablet, and a small number of oxycodone  tablets.  She missed her dialysis session today, she is to contact her dialysis center to try to get it rescheduled.  She is to return for worsening symptoms.     Final diagnoses:  Epigastric pain  Nausea and vomiting, unspecified vomiting type  End-stage renal disease on hemodialysis (HCC)  Anemia associated with chronic renal failure  ED Discharge Orders          Ordered    ondansetron  (ZOFRAN -ODT) 4 MG disintegrating tablet  Every 8 hours PRN        07/21/24 0733    oxyCODONE  (ROXICODONE ) 5 MG immediate release tablet  Every 4 hours PRN        07/21/24 0733    famotidine  (PEPCID ) 10 MG tablet  2 times daily         07/21/24 0733               Raford Lenis, MD 07/21/24 [phone]

## 2024-07-30 ENCOUNTER — Encounter (HOSPITAL_COMMUNITY): Payer: Self-pay

## 2024-07-30 ENCOUNTER — Inpatient Hospital Stay (HOSPITAL_COMMUNITY): Admit: 2024-07-30 | Admitting: Internal Medicine
